# Patient Record
Sex: Male | Born: 1975 | Race: White | Hispanic: No | Smoking: Never smoker
Health system: Southern US, Community
[De-identification: ages and names within clinical notes are randomized; demographics above are authoritative.]

## PROBLEM LIST (undated history)

## (undated) DIAGNOSIS — N289 Disorder of kidney and ureter, unspecified: Secondary | ICD-10-CM

---

## 2000-09-19 ENCOUNTER — Emergency Department (HOSPITAL_COMMUNITY): Admission: EM | Admit: 2000-09-19 | Discharge: 2000-09-19 | Payer: Self-pay

## 2000-09-30 ENCOUNTER — Emergency Department (HOSPITAL_COMMUNITY): Admission: EM | Admit: 2000-09-30 | Discharge: 2000-09-30 | Payer: Self-pay | Admitting: Emergency Medicine

## 2013-11-06 ENCOUNTER — Emergency Department (HOSPITAL_BASED_OUTPATIENT_CLINIC_OR_DEPARTMENT_OTHER): Payer: No Typology Code available for payment source

## 2013-11-06 ENCOUNTER — Emergency Department (HOSPITAL_BASED_OUTPATIENT_CLINIC_OR_DEPARTMENT_OTHER)
Admission: EM | Admit: 2013-11-06 | Discharge: 2013-11-06 | Disposition: A | Discharge status: Home / Self Care | Payer: No Typology Code available for payment source | Attending: Emergency Medicine | Admitting: Emergency Medicine

## 2013-11-06 ENCOUNTER — Encounter (HOSPITAL_BASED_OUTPATIENT_CLINIC_OR_DEPARTMENT_OTHER): Payer: Self-pay | Admitting: *Deleted

## 2013-11-06 DIAGNOSIS — R1032 Left lower quadrant pain: Secondary | ICD-10-CM | POA: Diagnosis present

## 2013-11-06 DIAGNOSIS — N200 Calculus of kidney: Secondary | ICD-10-CM | POA: Diagnosis not present

## 2013-11-06 DIAGNOSIS — Z79899 Other long term (current) drug therapy: Secondary | ICD-10-CM | POA: Diagnosis not present

## 2013-11-06 DIAGNOSIS — R319 Hematuria, unspecified: Secondary | ICD-10-CM

## 2013-11-06 HISTORY — DX: Disorder of kidney and ureter, unspecified: N28.9

## 2013-11-06 LAB — CBC WITH DIFFERENTIAL/PLATELET
Basophils Absolute: 0 10*3/uL (ref 0.0–0.1)
Basophils Relative: 1 % (ref 0–1)
Eosinophils Absolute: 0.2 10*3/uL (ref 0.0–0.7)
Eosinophils Relative: 3 % (ref 0–5)
HCT: 42.4 % (ref 39.0–52.0)
Hemoglobin: 14.6 g/dL (ref 13.0–17.0)
Lymphocytes Relative: 48 % — ABNORMAL HIGH (ref 12–46)
Lymphs Abs: 3.7 10*3/uL (ref 0.7–4.0)
MCH: 31.5 pg (ref 26.0–34.0)
MCHC: 34.4 g/dL (ref 30.0–36.0)
MCV: 91.6 fL (ref 78.0–100.0)
Monocytes Absolute: 0.8 10*3/uL (ref 0.1–1.0)
Monocytes Relative: 11 % (ref 3–12)
Neutro Abs: 2.8 10*3/uL (ref 1.7–7.7)
Neutrophils Relative %: 37 % — ABNORMAL LOW (ref 43–77)
Platelets: 222 10*3/uL (ref 150–400)
RBC: 4.63 MIL/uL (ref 4.22–5.81)
RDW: 11.8 % (ref 11.5–15.5)
WBC: 7.5 10*3/uL (ref 4.0–10.5)

## 2013-11-06 LAB — URINALYSIS, ROUTINE W REFLEX MICROSCOPIC
Bilirubin Urine: NEGATIVE
Glucose, UA: NEGATIVE mg/dL
Ketones, ur: NEGATIVE mg/dL
Nitrite: NEGATIVE
Protein, ur: NEGATIVE mg/dL
Specific Gravity, Urine: 1.023 (ref 1.005–1.030)
Urobilinogen, UA: 0.2 mg/dL (ref 0.0–1.0)
pH: 6 (ref 5.0–8.0)

## 2013-11-06 LAB — BASIC METABOLIC PANEL
Anion gap: 14 (ref 5–15)
BUN: 12 mg/dL (ref 6–23)
CO2: 25 mEq/L (ref 19–32)
Calcium: 9.3 mg/dL (ref 8.4–10.5)
Chloride: 104 mEq/L (ref 96–112)
Creatinine, Ser: 1 mg/dL (ref 0.50–1.35)
GFR calc Af Amer: >90 mL/min (ref >90)
GFR calc non Af Amer: >90 mL/min (ref >90)
Glucose, Bld: 101 mg/dL — ABNORMAL HIGH (ref 70–99)
Potassium: 4.1 mEq/L (ref 3.7–5.3)
Sodium: 143 mEq/L (ref 137–147)

## 2013-11-06 LAB — URINE MICROSCOPIC-ADD ON

## 2013-11-06 MED ORDER — KETOROLAC TROMETHAMINE 30 MG/ML IJ SOLN
30.0000 mg | Freq: Once | INTRAMUSCULAR | Status: AC
  Administered 2013-11-06: 30 mg via INTRAVENOUS
  Filled 2013-11-06: qty 1

## 2013-11-06 MED ORDER — SODIUM CHLORIDE 0.9 % IV SOLN
INTRAVENOUS | Status: DC
  Administered 2013-11-06: 20:00:00 via INTRAVENOUS

## 2013-11-06 MED ORDER — ONDANSETRON HCL 4 MG/2ML IJ SOLN
4.0000 mg | Freq: Once | INTRAMUSCULAR | Status: AC
  Administered 2013-11-06: 4 mg via INTRAVENOUS
  Filled 2013-11-06: qty 2

## 2013-11-06 MED ORDER — OXYCODONE-ACETAMINOPHEN 5-325 MG PO TABS: 1.0000 | ORAL_TABLET | Freq: Four times a day (QID) | ORAL | Status: AC | PRN

## 2013-11-06 NOTE — Discharge Instructions (Signed)
Call the urologist for follow up. Return here as needed.  Kidney Stones Kidney stones (urolithiasis) are deposits that form inside your kidneys. The intense pain is caused by the stone moving through the urinary tract. When the stone moves, the ureter goes into spasm around the stone. The stone is usually passed in the urine.  CAUSES   A disorder that makes certain neck glands produce too much parathyroid hormone (primary hyperparathyroidism).  A buildup of uric acid crystals, similar to gout in your joints.  Narrowing (stricture) of the ureter.  A kidney obstruction present at birth (congenital obstruction).  Previous surgery on the kidney or ureters.  Numerous kidney infections. SYMPTOMS   Feeling sick to your stomach (nauseous).  Throwing up (vomiting).  Blood in the urine (hematuria).  Pain that usually spreads (radiates) to the groin.  Frequency or urgency of urination. DIAGNOSIS   Taking a history and physical exam.  Blood or urine tests.  CT scan.  Occasionally, an examination of the inside of the urinary bladder (cystoscopy) is performed. TREATMENT   Observation.  Increasing your fluid intake.  Extracorporeal shock wave lithotripsy--This is a noninvasive procedure that uses shock waves to break up kidney stones.  Surgery may be needed if you have severe pain or persistent obstruction. There are various surgical procedures. Most of the procedures are performed with the use of small instruments. Only small incisions are needed to accommodate these instruments, so recovery time is minimized. The size, location, and chemical composition are all important variables that will determine the proper choice of action for you. Talk to your health care provider to better understand your situation so that you will minimize the risk of injury to yourself and your kidney.  HOME CARE INSTRUCTIONS   Drink enough water and fluids to keep your urine clear or pale yellow. This  will help you to pass the stone or stone fragments.  Strain all urine through the provided strainer. Keep all particulate matter and stones for your health care provider to see. The stone causing the pain may be as small as a grain of salt. It is very important to use the strainer each and every time you pass your urine. The collection of your stone will allow your health care provider to analyze it and verify that a stone has actually passed. The stone analysis will often identify what you can do to reduce the incidence of recurrences.  Only take over-the-counter or prescription medicines for pain, discomfort, or fever as directed by your health care provider.  Make a follow-up appointment with your health care provider as directed.  Get follow-up X-rays if required. The absence of pain does not always mean that the stone has passed. It may have only stopped moving. If the urine remains completely obstructed, it can cause loss of kidney function or even complete destruction of the kidney. It is your responsibility to make sure X-rays and follow-ups are completed. Ultrasounds of the kidney can show blockages and the status of the kidney. Ultrasounds are not associated with any radiation and can be performed easily in a matter of minutes. SEEK MEDICAL CARE IF:  You experience pain that is progressive and unresponsive to any pain medicine you have been prescribed. SEEK IMMEDIATE MEDICAL CARE IF:   Pain cannot be controlled with the prescribed medicine.  You have a fever or shaking chills.  The severity or intensity of pain increases over 18 hours and is not relieved by pain medicine.  You develop a new  onset of abdominal pain.  You feel faint or pass out.  You are unable to urinate. MAKE SURE YOU:   Understand these instructions.  Will watch your condition.  Will get help right away if you are not doing well or get worse. Document Released: 12/23/2004 Document Revised: 08/25/2012  Document Reviewed: 05/26/2012 [facility] Patient Information 2015 [employer], [place]. This information is not intended to replace advice given to you by your health care provider. Make sure you discuss any questions you have with your health care provider.

## 2013-11-06 NOTE — ED Notes (Signed)
Patient having lower abd pain and pain with urination. Pain then began in his left flank area.

## 2013-11-06 NOTE — ED Provider Notes (Signed)
CSN: [id number]     Arrival date & time 11/06/13  1846 History   First MD Initiated Contact with Patient 11/06/13 1933     Chief Complaint  Patient presents with  . Flank Pain     (Consider location/radiation/quality/duration/timing/severity/associated sxs/prior Treatment) Patient is a 38 y.o. male presenting with flank pain. The history is provided by the patient.  Flank Pain This is a new problem. The current episode started yesterday. The problem occurs constantly. The problem has been gradually worsening. Associated symptoms include abdominal pain and nausea. Pertinent negatives include no vomiting. Nothing aggravates the symptoms. He has tried nothing for the symptoms.   Arnell AsalGreg Laham is a 38 y.o. male who presents to the ED with lower abdominal pain and pain with urination that started yesterday. The pain is in the LLQ and radiates to the left flank area.  Past Medical History  Diagnosis Date  . Renal disorder     kidney stones   History reviewed. No pertinent past surgical history. No family history on file. History  Substance Use Topics  . Smoking status: Never Smoker   . Smokeless tobacco: Not on file  . Alcohol Use: No    Review of Systems  Gastrointestinal: Positive for nausea and abdominal pain. Negative for vomiting.  Genitourinary: Positive for flank pain.   All other systems negataive   Allergies  Review of patient's allergies indicates no known allergies.  Home Medications   Prior to Admission medications   Medication Sig Start Date End Date Taking? Authorizing Provider  atorvastatin (LIPITOR) 10 MG tablet Take 10 mg by mouth daily.   Yes Historical Provider, MD   BP 152/100 mmHg  Pulse 84  Temp(Src) 98 F (36.7 C) (Oral)  Resp 18  Ht 5\' 11"  (1.803 m)  Wt 180 lb (81.647 kg)  BMI 25.12 kg/m2  SpO2 100% Physical Exam  Constitutional: He is oriented to person, place, and time. He appears well-developed and well-nourished. No distress.  HENT:  Head:  Normocephalic.  Eyes: EOM are normal.  Neck: Neck supple.  Cardiovascular: Normal rate.   Pulmonary/Chest: Effort normal.  Abdominal: Soft. Bowel sounds are normal. There is tenderness in the left lower quadrant. There is no rebound and no guarding. CVA tenderness: left flank tenderness.  Musculoskeletal: Normal range of motion.  Neurological: He is alert and oriented to person, place, and time. No cranial nerve deficit.  Skin: Skin is warm and dry.  Psychiatric: He has a normal mood and affect. His behavior is normal.  Nursing note and vitals reviewed.   ED Course  Procedures (including critical care time) Labs Review Results for orders placed or performed during the hospital encounter of 11/06/13 (from the past 24 hour(s))  Urinalysis, Routine w reflex microscopic     Status: Abnormal   Collection Time: 11/06/13  6:54 PM  Result Value Ref Range   Color, Urine YELLOW YELLOW   APPearance CLEAR CLEAR   Specific Gravity, Urine 1.023 1.005 - 1.030   pH 6.0 5.0 - 8.0   Glucose, UA NEGATIVE NEGATIVE mg/dL   Hgb urine dipstick TRACE (A) NEGATIVE   Bilirubin Urine NEGATIVE NEGATIVE   Ketones, ur NEGATIVE NEGATIVE mg/dL   Protein, ur NEGATIVE NEGATIVE mg/dL   Urobilinogen, UA 0.2 0.0 - 1.0 mg/dL   Nitrite NEGATIVE NEGATIVE   Leukocytes, UA TRACE (A) NEGATIVE  Urine microscopic-add on     Status: Abnormal   Collection Time: 11/06/13  6:54 PM  Result Value Ref Range   Squamous Epithelial /  LPF RARE RARE   WBC, UA 3-6 <3 WBC/hpf   RBC / HPF 3-6 <3 RBC/hpf   Bacteria, UA RARE RARE   Crystals CA OXALATE CRYSTALS (A) NEGATIVE  CBC with Differential     Status: Abnormal   Collection Time: 11/06/13  8:05 PM  Result Value Ref Range   WBC 7.5 4.0 - 10.5 K/uL   RBC 4.63 4.22 - 5.81 MIL/uL   Hemoglobin 14.6 13.0 - 17.0 g/dL   HCT 16.142.4 09.639.0 - 04.552.0 %   MCV 91.6 78.0 - 100.0 fL   MCH 31.5 26.0 - 34.0 pg   MCHC 34.4 30.0 - 36.0 g/dL   RDW 40.911.8 81.111.5 - 91.415.5 %   Platelets 222 150 - 400 K/uL    Neutrophils Relative % 37 (L) 43 - 77 %   Neutro Abs 2.8 1.7 - 7.7 K/uL   Lymphocytes Relative 48 (H) 12 - 46 %   Lymphs Abs 3.7 0.7 - 4.0 K/uL   Monocytes Relative 11 3 - 12 %   Monocytes Absolute 0.8 0.1 - 1.0 K/uL   Eosinophils Relative 3 0 - 5 %   Eosinophils Absolute 0.2 0.0 - 0.7 K/uL   Basophils Relative 1 0 - 1 %   Basophils Absolute 0.0 0.0 - 0.1 K/uL  Basic metabolic panel     Status: Abnormal   Collection Time: 11/06/13  8:05 PM  Result Value Ref Range   Sodium 143 137 - 147 mEq/L   Potassium 4.1 3.7 - 5.3 mEq/L   Chloride 104 96 - 112 mEq/L   CO2 25 19 - 32 mEq/L   Glucose, Bld 101 (H) 70 - 99 mg/dL   BUN 12 6 - 23 mg/dL   Creatinine, Ser 7.821.00 0.50 - 1.35 mg/dL   Calcium 9.3 8.4 - 95.610.5 mg/dL   GFR calc non Af Amer >90 >90 mL/min   GFR calc Af Amer >90 >90 mL/min   Anion gap 14 5 - 15     Imaging Review Ct Renal Stone Study  11/06/2013   CLINICAL DATA:  Left flank pain for 2 days, history of renal stones  EXAM: CT ABDOMEN AND PELVIS WITHOUT CONTRAST  TECHNIQUE: Multidetector CT imaging of the abdomen and pelvis was performed following the standard protocol without IV contrast.  COMPARISON:  None.  FINDINGS: Lung bases are free of acute infiltrate or sizable effusion.  The liver, gallbladder, spleen, adrenal glands and kidneys are within normal limits. The right kidney is also within normal limits. The left kidney shows mild hydronephrosis and hydroureter which extends to the level of the ureterovesical junction. At this point on image number 73 of series to there is a 2 mm partially obstructing stone identified. The bladder is well distended.  The appendix is within normal limits. No diverticular abnormality is seen. No free pelvic fluid is noted. No acute bony abnormality is noted.  IMPRESSION: Tiny left UVJ stone with obstructive changes.  The remainder of the exam is within normal limits.   Electronically Signed   By: Alcide CleverMark  Lukens M.D.   On: 11/06/2013 20:31   After IV  fluids, Zofran and Toradol patient states the pain is almost gone.   MDM  38 y.o. male with dysuria, LLQ abdominal pain and left flank pain. Will treat for pain and patient is to follow up with urology. I have reviewed this patient's vital signs, nurses notes, appropriate labs and imaging.   I have discussed findings with the patient and plan of care and  he voices understanding and agrees with plan. Stable for discharge. BP 137/81 mmHg  Pulse 74  Temp(Src) 98 F (36.7 C) (Oral)  Resp 18  Ht 5\' 11"  (1.803 m)  Wt 180 lb (81.647 kg)  BMI 25.12 kg/m2  SpO2 98%    Medication List    TAKE these medications        oxyCODONE-acetaminophen 5-325 MG per tablet  Commonly known as:  ROXICET  Take 1-2 tablets by mouth every 6 (six) hours as needed for severe pain.      ASK your doctor about these medications        atorvastatin 10 MG tablet  Commonly known as:  LIPITOR  Take 10 mg by mouth daily.         Janne Napoleon, [place] 11/06/13 2107

## 2016-01-15 IMAGING — CT CT RENAL STONE PROTOCOL
2 of 4 series · 16 of 46 positions shown, 18 images · non-contrast
Comparison: None.

CLINICAL DATA: Left flank pain for 2 days, history of renal stones

EXAM:
CT ABDOMEN AND PELVIS WITHOUT CONTRAST
TECHNIQUE: Multidetector CT imaging of the abdomen and pelvis was performed
following the standard protocol without IV contrast.

[Series 2: renal stone < 200 lbs 5.0 b31f · axial · 0.66mm/px · z∈[-463,-33]mm · 13 of 94 slices shown, 15 images]
[im 4/94  soft-tissue]
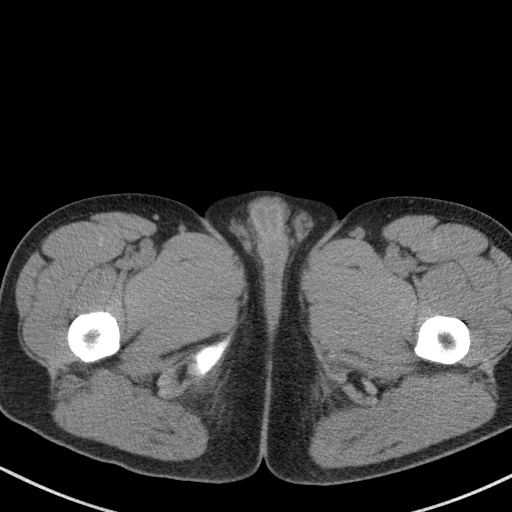
[im 4/94  bone]
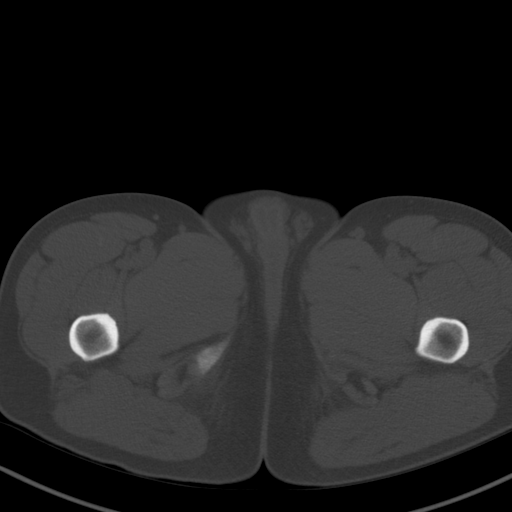
[im 12/94  soft-tissue]
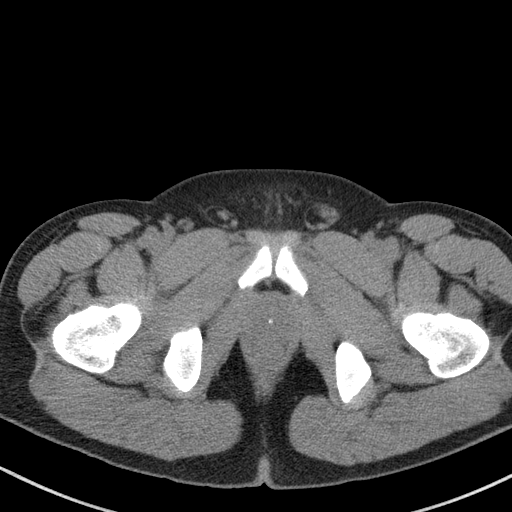
[im 19/94  soft-tissue]
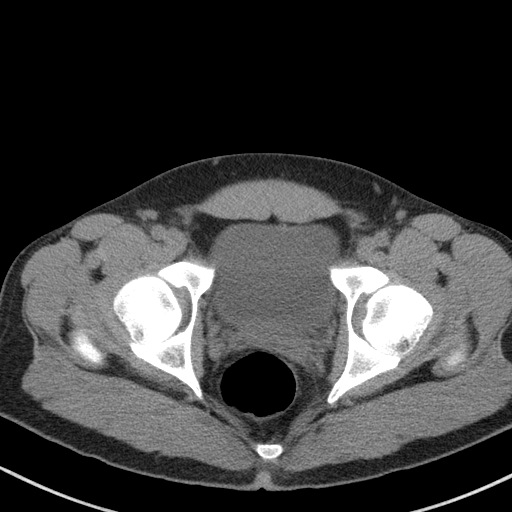
[im 27/94  soft-tissue]
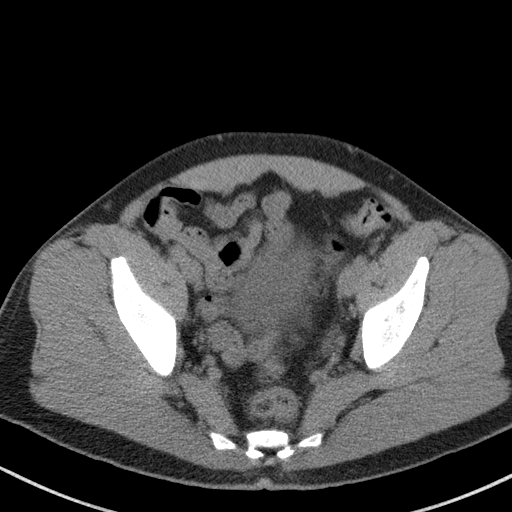
[im 34/94  soft-tissue]
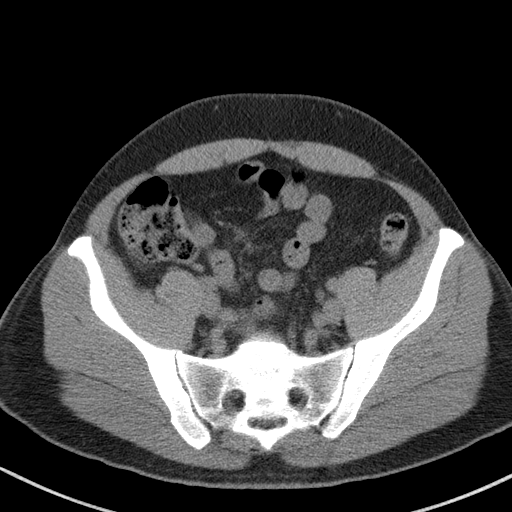
[im 41/94  soft-tissue]
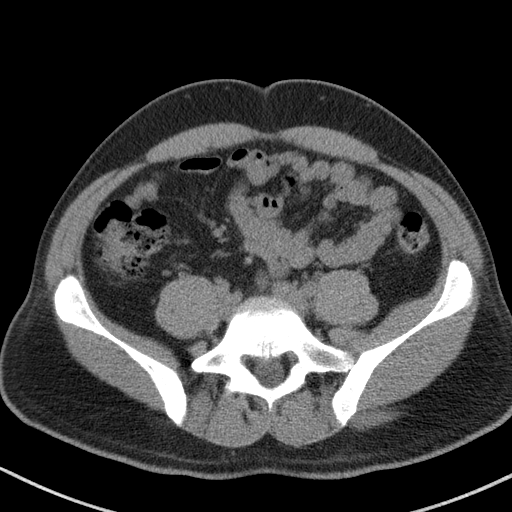
[im 49/94  soft-tissue]
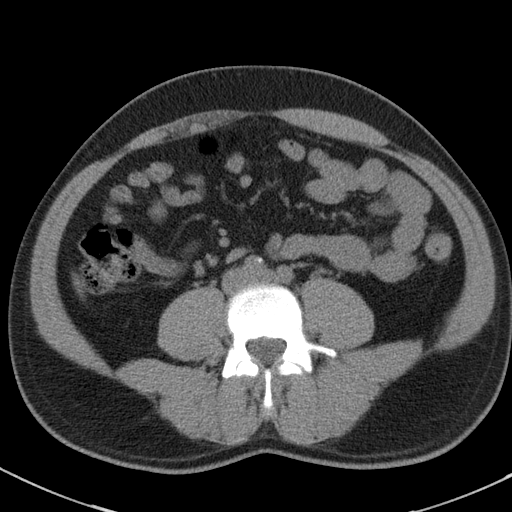
[im 53/94  soft-tissue]
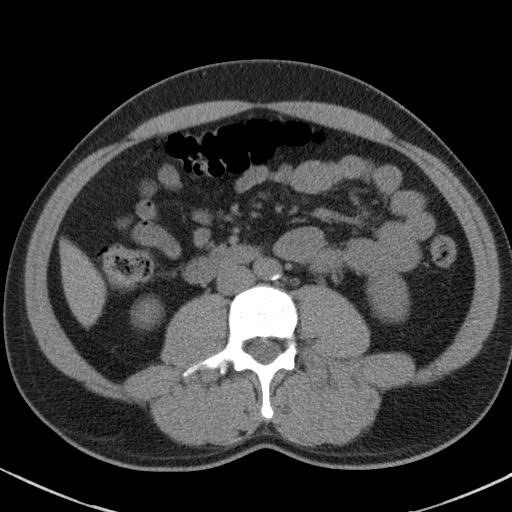
[im 60/94  soft-tissue]
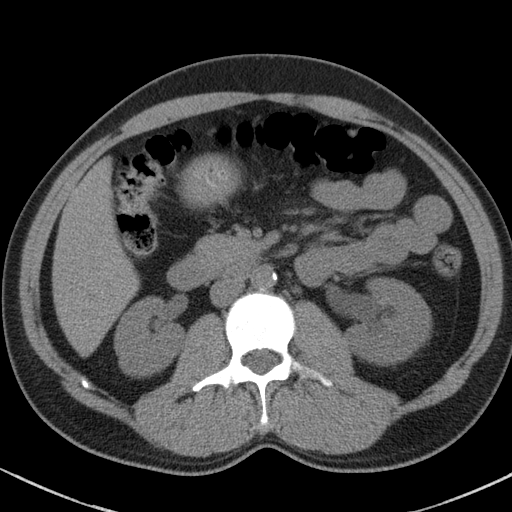
[im 60/94  bone]
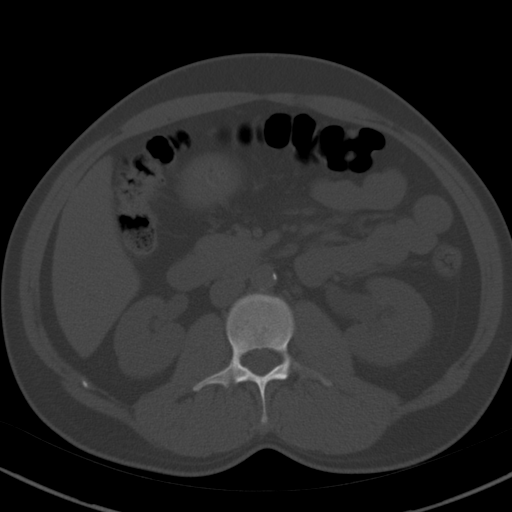
[im 67/94  soft-tissue]
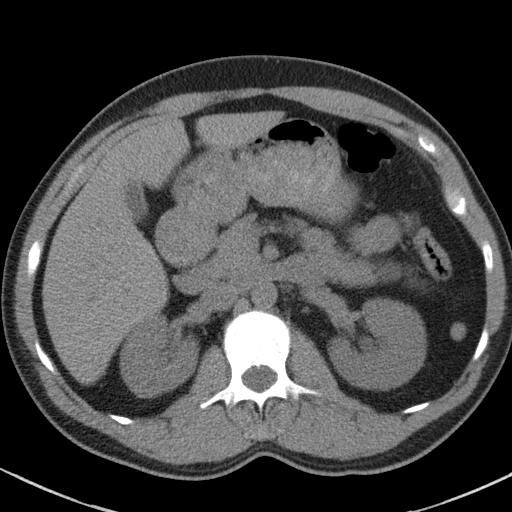
[im 75/94  soft-tissue]
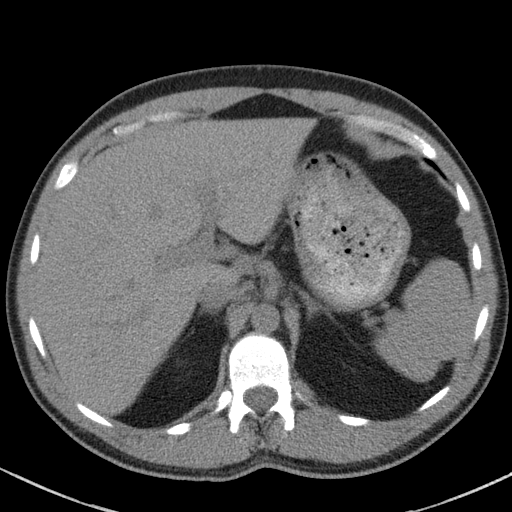
[im 82/94  soft-tissue]
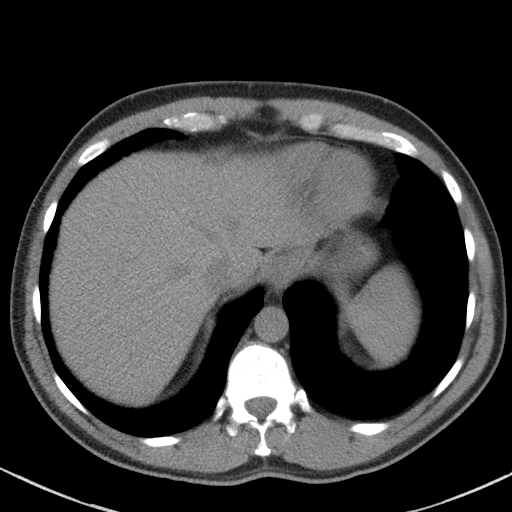
[im 90/94  soft-tissue]
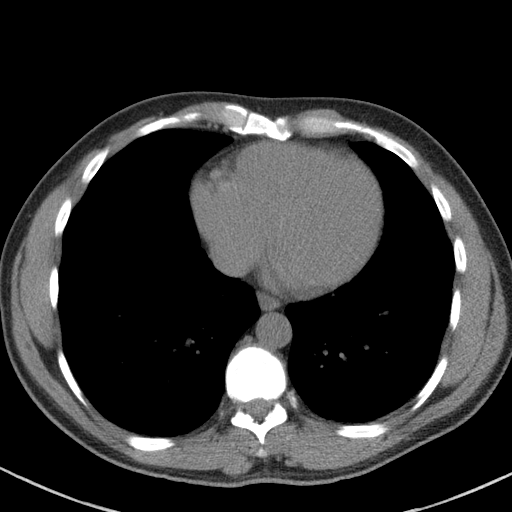

[Series 5: renal stone 3.0 coronal · coronal · 0.69mm/px · 3 of 95 slices shown]
[im 32/95  soft-tissue]
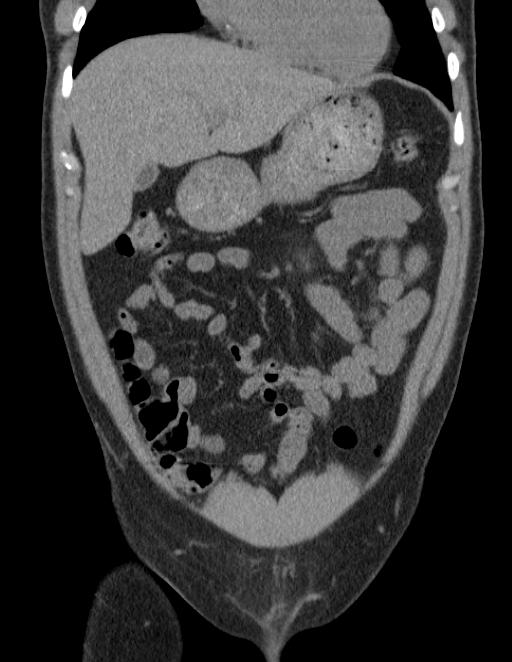
[im 42/95  soft-tissue]
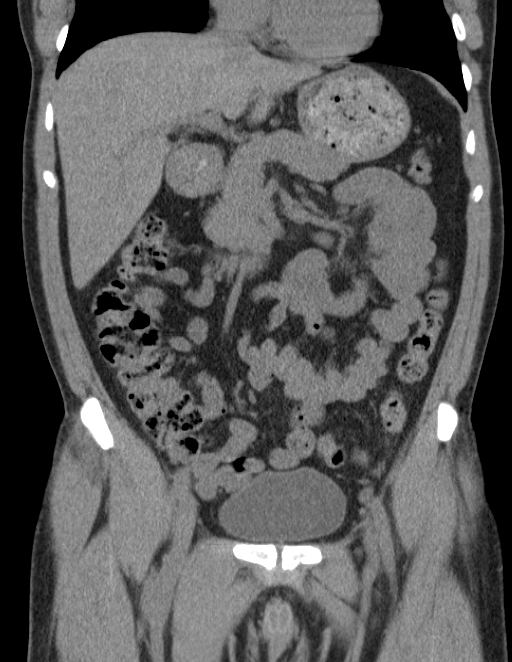
[im 53/95  soft-tissue]
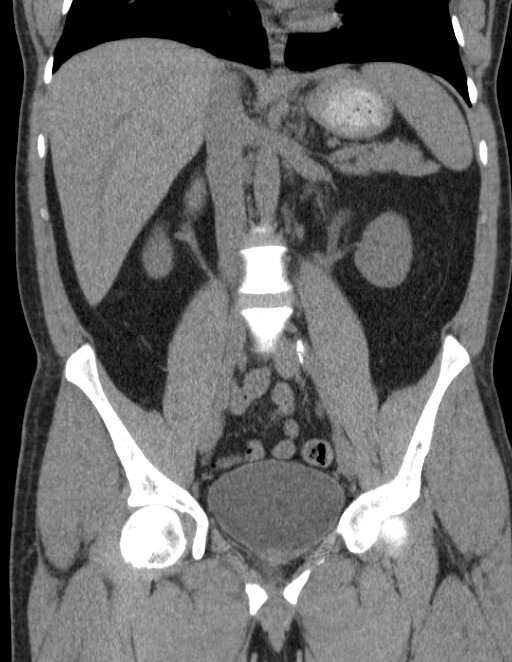

[16 of 46 positions shown; findings below may reference images not displayed]

FINDINGS: Lung bases are free of acute infiltrate or sizable effusion.

The liver, gallbladder, spleen, adrenal glands and kidneys are
within normal limits. The right kidney is also within normal limits.
The left kidney shows mild hydronephrosis and hydroureter which
extends to the level of the ureterovesical junction. At this point
on image number 73 of series to there is a 2 mm partially
obstructing stone identified. The bladder is well distended.

The appendix is within normal limits. No diverticular abnormality is
seen. No free pelvic fluid is noted. No acute bony abnormality is
noted.
IMPRESSION: Tiny left UVJ stone with obstructive changes.

The remainder of the exam is within normal limits.
